# Patient Record
Sex: Female | Born: 2000 | Race: Black or African American | Hispanic: No | Marital: Single
Health system: Southern US, Community
[De-identification: ages and names within clinical notes are randomized; demographics above are authoritative.]

## PROBLEM LIST (undated history)

## (undated) DIAGNOSIS — T7840XA Allergy, unspecified, initial encounter: Secondary | ICD-10-CM

## (undated) DIAGNOSIS — H539 Unspecified visual disturbance: Secondary | ICD-10-CM

## (undated) HISTORY — DX: Allergy, unspecified, initial encounter: T78.40XA

## (undated) HISTORY — DX: Unspecified visual disturbance: H53.9

---
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
## 2019-09-24 ENCOUNTER — Other Ambulatory Visit: Payer: Self-pay

## 2019-09-24 ENCOUNTER — Encounter (HOSPITAL_COMMUNITY): Payer: Self-pay | Admitting: Emergency Medicine

## 2019-09-24 ENCOUNTER — Ambulatory Visit (HOSPITAL_COMMUNITY)
Admission: EM | Admit: 2019-09-24 | Discharge: 2019-09-24 | Disposition: A | Discharge status: Home / Self Care | Payer: Managed Care, Other (non HMO) | Attending: Internal Medicine | Admitting: Internal Medicine

## 2019-09-24 DIAGNOSIS — M545 Low back pain, unspecified: Secondary | ICD-10-CM

## 2019-09-24 MED ORDER — IBUPROFEN 600 MG PO TABS: 600.0000 mg | ORAL_TABLET | Freq: Four times a day (QID) | ORAL | 0 refills | Status: AC | PRN

## 2019-09-24 MED ORDER — CYCLOBENZAPRINE HCL 10 MG PO TABS: 10.0000 mg | ORAL_TABLET | Freq: Two times a day (BID) | ORAL | 0 refills | Status: AC | PRN
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
## 2019-09-24 NOTE — Discharge Instructions (Addendum)
Gentle range of motion exercises Take medications as prescribed If you experience headaches, persistent nausea/vomiting, change in mentation or vision-please go to the emergency department to be reevaluated.

## 2022-02-15 DIAGNOSIS — R0602 Shortness of breath: Secondary | ICD-10-CM | POA: Diagnosis not present

## 2022-02-15 DIAGNOSIS — I10 Essential (primary) hypertension: Secondary | ICD-10-CM | POA: Diagnosis not present

## 2022-02-15 DIAGNOSIS — J069 Acute upper respiratory infection, unspecified: Secondary | ICD-10-CM | POA: Diagnosis not present

## 2022-02-15 DIAGNOSIS — H9203 Otalgia, bilateral: Secondary | ICD-10-CM | POA: Diagnosis not present
